# Patient Record
Sex: Female | Born: 1990 | Hispanic: No | Marital: Married | Smoking: Never smoker
Health system: Southern US, Community
[De-identification: ages and names within clinical notes are randomized; demographics above are authoritative.]

---

## 2017-07-28 DIAGNOSIS — R0602 Shortness of breath: Secondary | ICD-10-CM | POA: Diagnosis not present

## 2017-07-28 DIAGNOSIS — Z0000 Encounter for general adult medical examination without abnormal findings: Secondary | ICD-10-CM | POA: Diagnosis not present

## 2017-08-12 DIAGNOSIS — R9431 Abnormal electrocardiogram [ECG] [EKG]: Secondary | ICD-10-CM | POA: Diagnosis not present

## 2017-10-20 DIAGNOSIS — Z23 Encounter for immunization: Secondary | ICD-10-CM | POA: Diagnosis not present

## 2018-03-30 ENCOUNTER — Ambulatory Visit (HOSPITAL_COMMUNITY)
Admission: RE | Admit: 2018-03-30 | Discharge: 2018-03-30 | Disposition: A | Discharge status: Home / Self Care | Payer: Medicaid Other | Source: Ambulatory Visit | Attending: Family Medicine | Admitting: Family Medicine

## 2018-03-30 ENCOUNTER — Encounter: Payer: Self-pay | Admitting: Family Medicine

## 2018-03-30 ENCOUNTER — Ambulatory Visit: Payer: Medicaid Other | Admitting: Family Medicine

## 2018-03-30 VITALS — BP 122/81 | HR 100 | Temp 98.1°F | Ht 60.0 in | Wt 144.0 lb

## 2018-03-30 DIAGNOSIS — R8290 Unspecified abnormal findings in urine: Secondary | ICD-10-CM | POA: Insufficient documentation

## 2018-03-30 DIAGNOSIS — R002 Palpitations: Secondary | ICD-10-CM | POA: Insufficient documentation

## 2018-03-30 DIAGNOSIS — R0602 Shortness of breath: Secondary | ICD-10-CM | POA: Diagnosis present

## 2018-03-30 DIAGNOSIS — N979 Female infertility, unspecified: Secondary | ICD-10-CM | POA: Insufficient documentation

## 2018-03-30 DIAGNOSIS — R1030 Lower abdominal pain, unspecified: Secondary | ICD-10-CM

## 2018-03-30 DIAGNOSIS — R1024 Suprapubic pain: Secondary | ICD-10-CM

## 2018-03-30 DIAGNOSIS — R102 Pelvic and perineal pain: Secondary | ICD-10-CM

## 2018-03-30 DIAGNOSIS — R319 Hematuria, unspecified: Secondary | ICD-10-CM

## 2018-03-30 DIAGNOSIS — R0609 Other forms of dyspnea: Secondary | ICD-10-CM | POA: Diagnosis not present

## 2018-03-30 LAB — POCT URINALYSIS DIP (CLINITEK)
Bilirubin, UA: NEGATIVE
Glucose, UA: NEGATIVE mg/dL
Ketones, POC UA: NEGATIVE mg/dL
Leukocytes, UA: NEGATIVE
Nitrite, UA: NEGATIVE
POC PROTEIN,UA: NEGATIVE
Spec Grav, UA: 1.015
Urobilinogen, UA: 0.2 U/dL
pH, UA: 5.5

## 2018-03-30 LAB — POCT URINE PREGNANCY: Preg Test, Ur: NEGATIVE

## 2018-03-30 NOTE — Progress Notes (Signed)
Subjective:    Patient ID: Melanie Greene, female    DOB: 1990-05-01, 28 y.o.   MRN: [id number]   Due to a language barrier, patient is accompanied by a live interpreter at today's visit.  HPI       28 year old female new to the practice.  Patient states that her main concern at today's visit is that she and her husband have been trying to have another child since the birth of their third child 7 years ago but patient has been unable to become pregnant.  Patient reports that she is having her menses on a regular schedule.  Patient has not been on any kind of contraceptive medication and has not had any contraceptive devices.  Patient states that she had a C-section with her first pregnancy, vaginal birth with a second pregnancy and C-section with her third pregnancy.  Patient did not have any complications during pregnancy or childbirth.  Patient denies any issues with elevated blood pressure/hypertension during pregnancy and no issues with elevated blood sugar during pregnancy.  Patient has not been on any prescription medications and has no significant past medical history.  Patient does not smoke cigarettes or drink alcohol.  Patient is currently employed in a Teacher, adult education.  Family history is negative for fertility issues.  Patient has had no family history of cancer, hypertension, diabetes, heart disease or stroke.       On review of systems, patient denies any headaches or dizziness, no difficulty swallowing, no cough.  Patient does get occasional shortness of breath with exertion.  Patient does not smoke but states that she is exposed to secondhand smoke from her husband.  Patient denies any chest pain but occasionally has a sensation of increased heart rate/pounding heartbeat.  Patient initially denied abdominal pain but then states that sometimes she would have discomfort in her lower abdomen just above her C-section scar.  Discomfort is usually mild and she does not recall any  factors that cause the discomfort to occur.  Patient does have some occasional mild abdominal cramping during her menses.  Patient denies urinary frequency but occasionally feels as if she has some discomfort with urination.  Patient has had no pelvic or vaginal pain.    Review of Systems  Constitutional: Negative for chills, fatigue and fever.  HENT: Negative for congestion, hearing loss, sore throat and trouble swallowing.   Respiratory: Negative for cough and shortness of breath.   Cardiovascular: Positive for palpitations (sensation of fast/pounding heart rate). Negative for chest pain and leg swelling.  Gastrointestinal: Positive for abdominal pain (occasional mid lower abdominal discomfort). Negative for blood in stool, constipation, diarrhea and nausea.  Endocrine: Negative for cold intolerance, heat intolerance, polydipsia, polyphagia and polyuria.  Genitourinary: Positive for dysuria (occasional). Negative for flank pain, frequency, menstrual problem, pelvic pain, vaginal discharge and vaginal pain.  Musculoskeletal: Negative for back pain and gait problem.  Neurological: Negative for dizziness and headaches.  Hematological: Negative for adenopathy. Does not bruise/bleed easily.       Objective:   Physical Exam BP 122/81 (BP Location: Left Arm, Patient Position: Sitting, Cuff Size: Large)   Pulse 100   Temp 98.1 F (36.7 C) (Oral)   Ht 5' (1.524 m)   Wt 144 lb (65.3 kg)   LMP 03/25/2018 (Exact Date)   SpO2 98%   BMI 28.12 kg/m   Nurse's notes and vital signs reviewed  General-well-nourished, well-developed short statured female in no acute distress.  Patient is accompanied by live interpreter  at today's visit ENT- TMs gray, mild edema of the nasal turbinates, normal oropharynx Neck-supple, no lymphadenopathy, no thyromegaly Lungs-clear to auscultation bilaterally Cardiovascular-regular rate and regular rhythm Lungs-clear to auscultation bilaterally, breathing is  nonlabored Abdomen-soft, patient with mild discomfort with palpation over the suprapubic area.  No abnormalities with palpation over the site of prior C-section scar Back-no CVA tenderness Extremities-no edema.      Assessment & Plan:  1. Infertility, female Patient with complaint of inability to become pregnant over the past 7 years.  Patient reports that she is having regular menses.  Patient questions whether or not she may have been given a tubal ligation without her knowledge or consent after the birth of her third child.  Patient will be referred to OB/GYN for further evaluation and treatment of infertility and patient will have urine pregnancy test at today's visit just to rule out current pregnancy. - Ambulatory referral to Obstetrics / Gynecology - POCT urine pregnancy  2. Palpitations Patient reports episodes of feeling as if she has increased heart rate/pounding heart rate.  Patient will have BMP to look for electrolyte abnormality, CBC to look for anemia and TSH to look for thyroid disorder.  If these are normal and patient has continued symptoms, patient may require referral for cardiac monitoring - Basic Metabolic Panel - CBC with Differential - TSH  3. SOB (shortness of breath) on exertion Patient with complaint of shortness of breath with exertion and patient will have CBC to look for possible anemia as a cause of her shortness of breath with exertion and order was placed for patient to have chest x-ray - DG Chest 2 View; Future - CBC with Differential  4. Suprapubic discomfort Patient with suprapubic discomfort on exam and patient will have urinalysis as well as urine hCG and follow-up.  Patient should return to clinic if she has continued symptoms - POCT URINALYSIS DIP (CLIN[facility]K) - POCT urine pregnancy  5. Abnormal urinalysis Patient with abnormal finding of hematuria on urinalysis and urine will be sent for culture.  Patient will be notified if further treatment is  needed based on culture results and will need future repeat UA to see if hematuria is still present.  If patient with continued hematuria on urinalysis then she will need referral to urology - Urine Culture  An After Visit Summary was printed and given to the patient.  Return in about 3 months (around 06/28/2018) for sooner if continued abdominal pain or SOB.

## 2018-03-31 ENCOUNTER — Telehealth: Payer: Self-pay | Admitting: Family Medicine

## 2018-03-31 LAB — CBC WITH DIFFERENTIAL/PLATELET
Basophils Absolute: 0.1 x10E3/uL (ref 0.0–0.2)
Basos: 1 %
EOS (ABSOLUTE): 0.4 x10E3/uL (ref 0.0–0.4)
Eos: 6 %
Hematocrit: 41 % (ref 34.0–46.6)
Hemoglobin: 14.1 g/dL (ref 11.1–15.9)
Immature Grans (Abs): 0 x10E3/uL (ref 0.0–0.1)
Immature Granulocytes: 0 %
Lymphocytes Absolute: 1.8 x10E3/uL (ref 0.7–3.1)
Lymphs: 27 %
MCH: 30.7 pg (ref 26.6–33.0)
MCHC: 34.4 g/dL (ref 31.5–35.7)
MCV: 89 fL (ref 79–97)
Monocytes Absolute: 0.5 x10E3/uL (ref 0.1–0.9)
Monocytes: 8 %
Neutrophils Absolute: 3.8 x10E3/uL (ref 1.4–7.0)
Neutrophils: 58 %
Platelets: 217 x10E3/uL (ref 150–450)
RBC: 4.6 x10E6/uL (ref 3.77–5.28)
RDW: 12.2 % (ref 11.7–15.4)
WBC: 6.6 x10E3/uL (ref 3.4–10.8)

## 2018-03-31 LAB — BASIC METABOLIC PANEL WITH GFR
BUN/Creatinine Ratio: 19 (ref 9–23)
BUN: 10 mg/dL (ref 6–20)
CO2: 24 mmol/L (ref 20–29)
Calcium: 9 mg/dL (ref 8.7–10.2)
Chloride: 103 mmol/L (ref 96–106)
Creatinine, Ser: 0.52 mg/dL — ABNORMAL LOW (ref 0.57–1.00)
GFR calc Af Amer: 149 mL/min/1.73
GFR calc non Af Amer: 130 mL/min/1.73
Glucose: 79 mg/dL (ref 65–99)
Potassium: 3.4 mmol/L — ABNORMAL LOW (ref 3.5–5.2)
Sodium: 141 mmol/L (ref 134–144)

## 2018-03-31 LAB — TSH: TSH: 2.89 u[IU]/mL (ref 0.450–4.500)

## 2018-03-31 NOTE — Telephone Encounter (Signed)
I left a voice mail to patient to let her know that Medicaid don't pay for infertility Patient can contact her Social Worker or   if she decide to pay out of her pocket  [facility]  Dr. Lyndal Rainbow office at [facility] ([address] [place]  [address]   Ph# [phone]

## 2018-04-01 LAB — URINE CULTURE

## 2018-04-14 ENCOUNTER — Telehealth: Payer: Self-pay | Admitting: *Deleted

## 2018-04-14 NOTE — Telephone Encounter (Signed)
Medical Assistant used [employer] to contact patient.  Interpreter Name: Maylon Cos #: [id number] Patient was not available, [employer] left patient a voicemail. Medical Assistant left message on patient's home and cell voicemail. Voicemail states to give a call back to [place] with [facility] at [phone].

## 2018-04-14 NOTE — Telephone Encounter (Signed)
-----   Message from Cain Saupe, MD sent at 04/01/2018 12:51 AM EST ----- Notify patient of normal CBC and TSH. On BMP, potassium is just below normal at 3.4 (normal 3.5-5.2). Increase potassium rich foods such as bananas or orange juice to raise potassium level

## 2018-04-20 NOTE — Telephone Encounter (Signed)
Medical Assistant used [employer] to contact patient.  Interpreter Name: Marisue Humble  Interpreter #: [phone] Medical Assistant left message on patient's home and cell voicemail. Voicemail states to give a call back to [place] with [facility] at [phone]. Communication letter has been mailed out to patient.

## 2018-04-20 NOTE — Progress Notes (Signed)
Melanie Greene, I will call.

## 2018-06-30 ENCOUNTER — Ambulatory Visit: Payer: Medicaid Other | Admitting: Family Medicine

## 2018-12-29 DIAGNOSIS — Z23 Encounter for immunization: Secondary | ICD-10-CM | POA: Diagnosis not present

## 2019-01-19 DIAGNOSIS — Z23 Encounter for immunization: Secondary | ICD-10-CM | POA: Diagnosis not present

## 2020-03-22 IMAGING — DX DG CHEST 2V
2 series · 2 of 2 positions shown · non-contrast
Comparison: None.

CLINICAL DATA: Sob on exertion

EXAM:
CHEST - 2 VIEW

[chest pa]
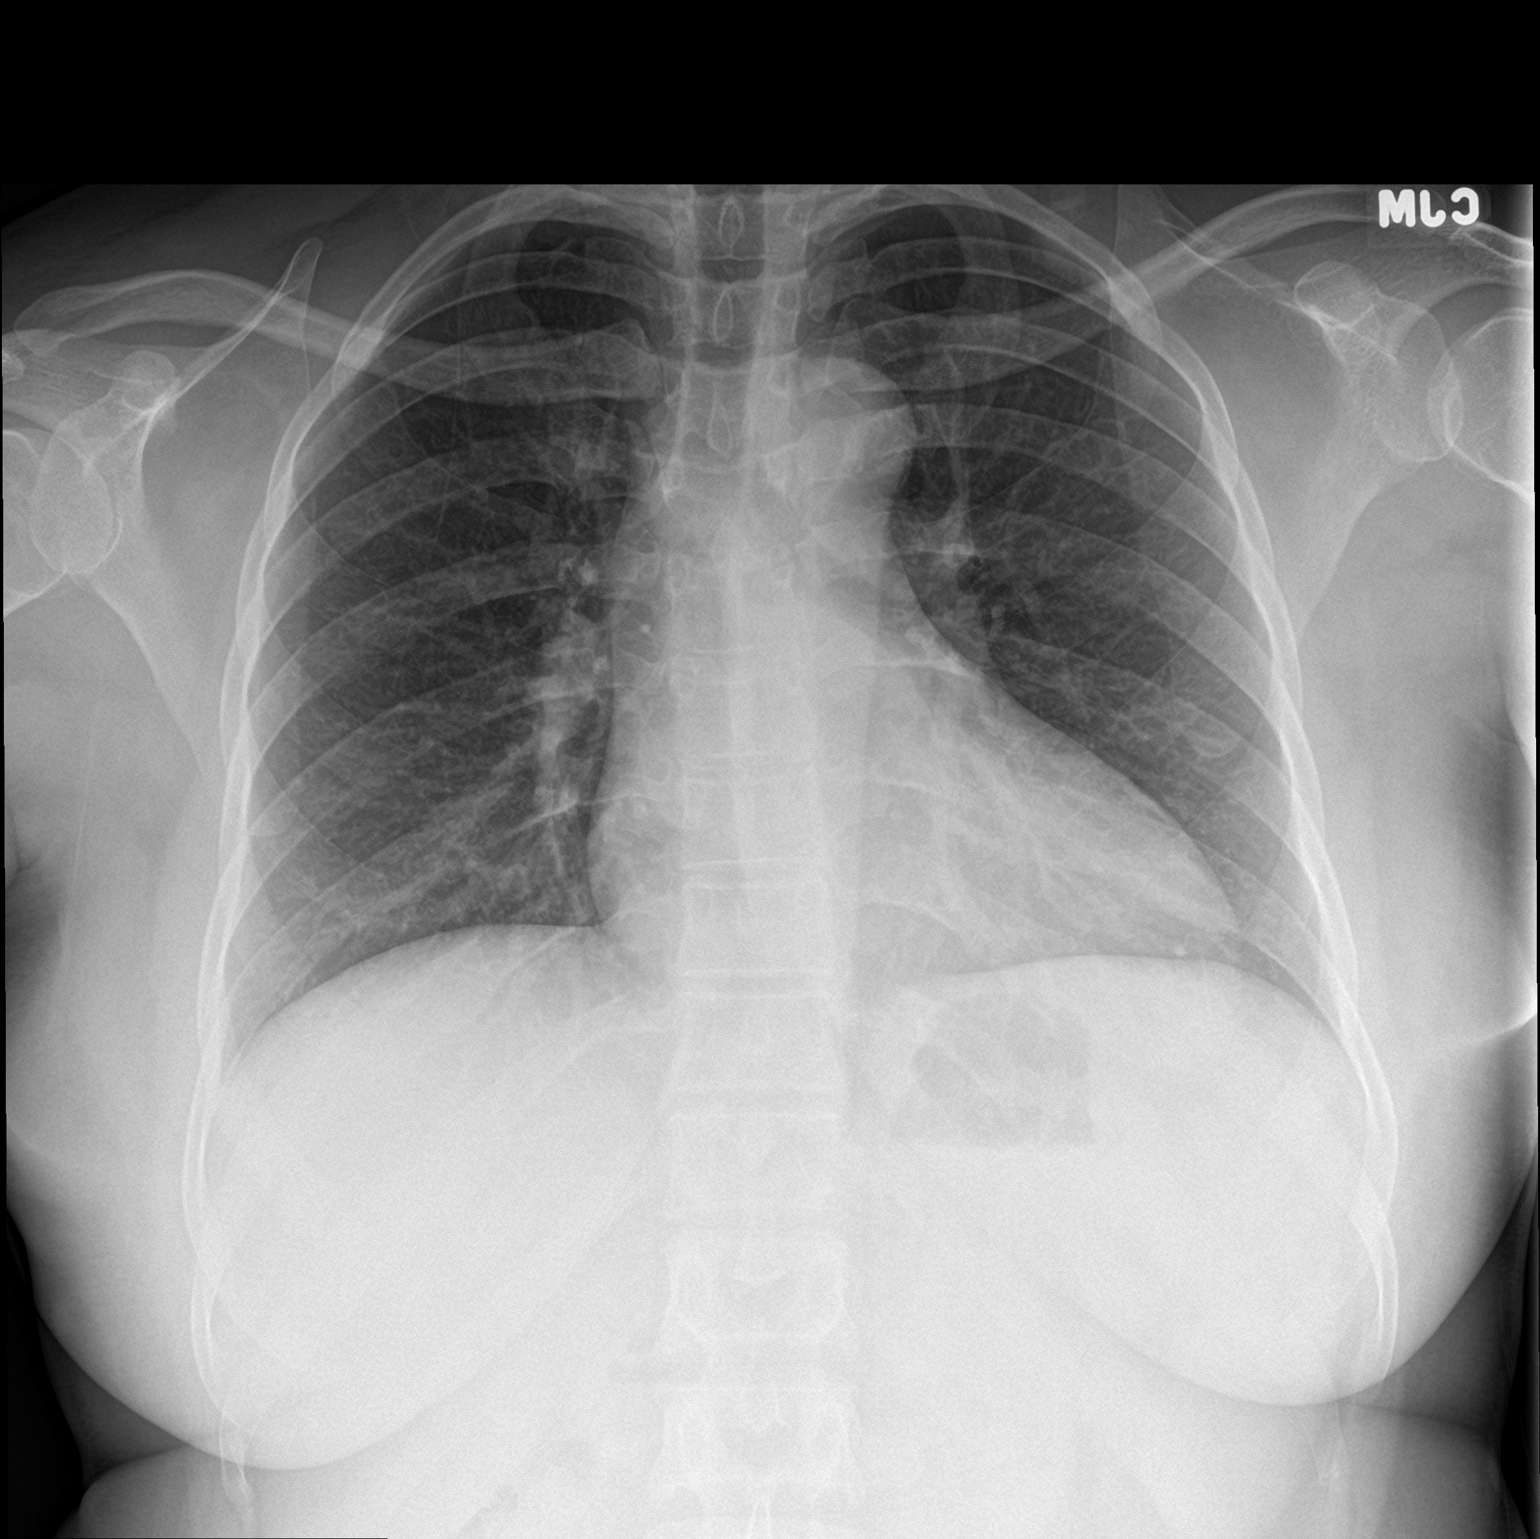

[chest lat]
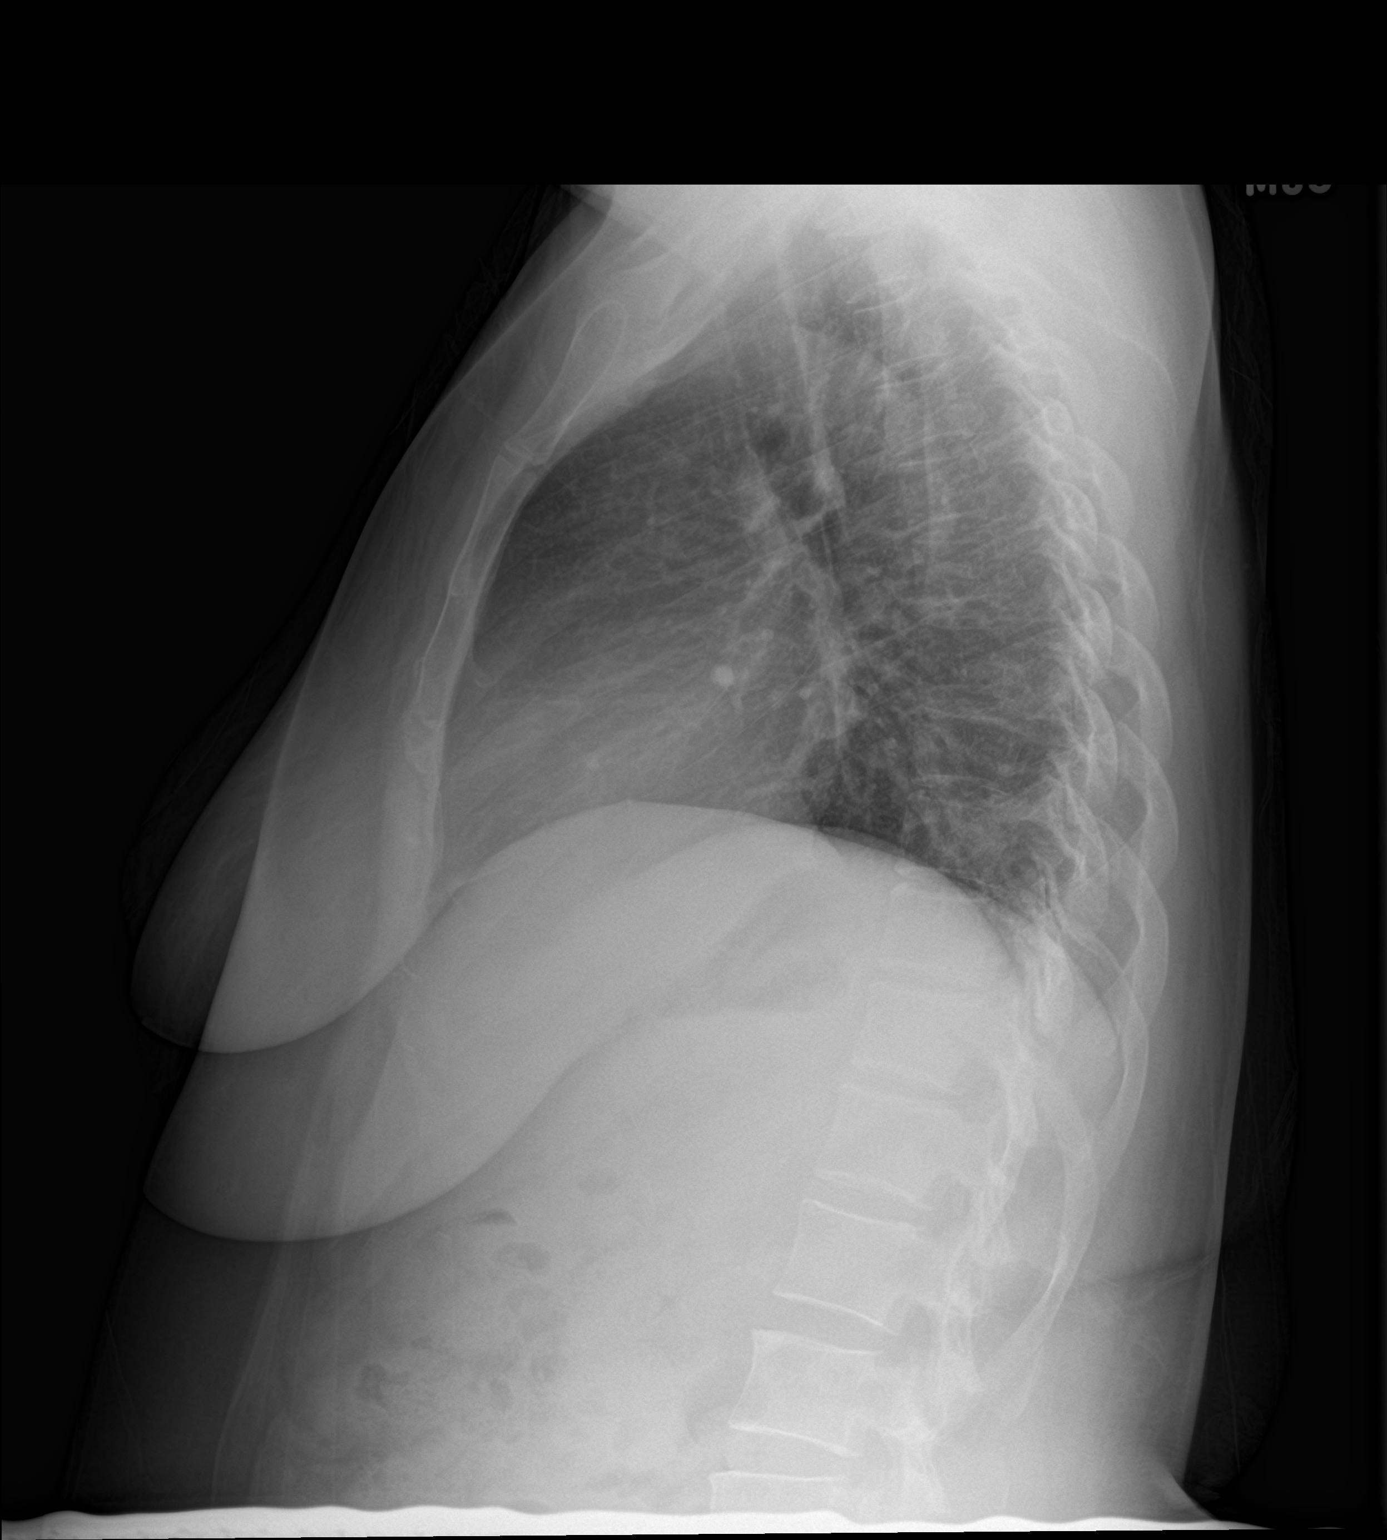

[2 of 2 positions shown; findings below may reference images not displayed]

FINDINGS: The heart size and mediastinal contours are within normal limits.
Both lungs are clear. The visualized skeletal structures are
unremarkable.
IMPRESSION: No active cardiopulmonary disease.

## 2020-05-01 ENCOUNTER — Other Ambulatory Visit: Payer: Self-pay

## 2020-05-01 ENCOUNTER — Encounter: Payer: Self-pay | Admitting: Family Medicine

## 2020-05-01 ENCOUNTER — Ambulatory Visit: Payer: Medicaid Other | Attending: Family Medicine | Admitting: Family Medicine

## 2020-05-01 VITALS — BP 138/84 | HR 60 | Ht 59.0 in | Wt 132.4 lb

## 2020-05-01 DIAGNOSIS — N979 Female infertility, unspecified: Secondary | ICD-10-CM

## 2020-05-01 NOTE — Patient Instructions (Signed)
Female Infertility  Female infertility refers to a woman's inability to get pregnant (conceive) after a year of having sex regularly (or after 6 months in women over age 31) without using birth control. Infertility can also mean that a woman is not able to carry a pregnancy to full term. Both women and men can have fertility problems. What are the causes? This condition may be caused by:  Problems with reproductive organs. Infertility can result if a woman: ? Has an abnormally short cervix or a cervix that does not remain closed during a pregnancy. ? Has a blockage or scarring in the fallopian tubes. ? Has an abnormally shaped uterus. ? Has uterine fibroids. This is a benign mass of tissue or muscle (tumor) that can develop in the uterus. ? Is not ovulating in a regular way.  Certain medical conditions. These may include: ? Polycystic ovary syndrome (PCOS). This is a hormonal disorder that can cause small cysts to grow on the ovaries. This is the most common cause of infertility in women. ? Endometriosis. This is a condition in which the tissue that lines the uterus (endometrium) grows outside of its normal location. ? Cancer and cancer treatments, such as chemotherapy or radiation. ? Premature ovarian failure. This is when ovaries stop producing eggs and hormones before age 40. ? Sexually transmitted diseases, such as chlamydia or gonorrhea. ? Autoimmune disorders. These are disorders in which the body's defense system (immune system) attacks normal, healthy cells. Infertility can be linked to more than one cause. For some women, the cause of infertility is not known (unexplained infertility). What increases the risk?  Age. A woman's fertility declines with age, especially after her mid-30s.  Being underweight or overweight.  Drinking too much alcohol.  Using drugs such as anabolic steroids, cocaine, and marijuana.  Exercising excessively.  Being exposed to environmental toxins,  such as radiation, pesticides, and certain chemicals. What are the signs or symptoms? The main sign of infertility in women is the inability to get pregnant or carry a pregnancy to full term. How is this diagnosed? This condition may be diagnosed by:  Checking whether you are ovulating each month. The tests may include: ? Blood tests to check hormone levels. ? An ultrasound of the ovaries. ? Taking a small tissue that lines the uterus and checking it under a microscope (endometrial biopsy).  Doing additional tests. This is done if ovulation is normal. Tests may include: ? Hysterosalpingography. This X-ray test can show the shape of the uterus and whether the fallopian tubes are open. ? Laparoscopy. This test uses a lighted tube (laparoscope) to look for problems in the fallopian tubes and other organs. ? Transvaginal ultrasound. This imaging test is used to check for abnormalities in the uterus and ovaries. ? Hysteroscopy. This test uses a lighted tube to check for problems in the cervix and the uterus. To be diagnosed with infertility, both partners will have a physical exam. Both partners will also have an extensive medical and sexual history taken. Additional tests may be done. How is this treated? Treatment depends on the cause of infertility. Most cases of infertility in women are treated with medicine or surgery.  Women may take medicine to: ? Correct ovulation problems. ? Treat other health conditions.  Surgery may be done to: ? Repair damage to the ovaries, fallopian tubes, cervix, or uterus. ? Remove growths from the uterus. ? Remove scar tissue from the uterus, pelvis, or other organs. Assisted reproductive technology (ART) Assisted reproductive technology (  ART) refers to all treatments and procedures that combine eggs and sperm outside the body to try to help a couple conceive. ART is often combined with fertility drugs to stimulate ovulation. Sometimes ART is done using eggs  retrieved from another woman's body (donor eggs) or from previously frozen fertilized eggs (embryos). There are different types of ART. These include:  Intrauterine insemination (IUI). A long, thin tube is used to place sperm directly into a woman's uterus. This procedure: ? Is effective for infertility caused by sperm problems, including low sperm count and low motility. ? Can be used in combination with fertility drugs.  In vitro fertilization (IVF). This is done when a woman's fallopian tubes are blocked or when a man has low sperm count. In this procedure: ? Fertility drugs are used to stimulate the ovaries to produce multiple eggs. ? Once mature, these eggs are removed from the body and combined with the sperm to be fertilized. ? The fertilized eggs are then placed into the woman's uterus. Follow these instructions at home:  Take over-the-counter and prescription medicines only as told by your health care provider.  Do not use any products that contain nicotine or tobacco, such as cigarettes and e-cigarettes. If you need help quitting, ask your health care provider.  If you drink alcohol, limit how much you have to 1 drink a day.  Make dietary changes to lose weight or maintain a healthy weight. Work with your health care provider and a dietitian to set a weight-loss goal that is healthy and reasonable for you.  Seek support from a counselor or support group to talk about your concerns related to infertility. Couples counseling may be helpful for you and your partner.  Practice stress reduction techniques that work well for you, such as regular physical activity, meditation, or deep breathing.  Keep all follow-up visits as told by your health care provider. This is important. Contact a health care provider if you:  Feel that stress is interfering with your life and relationships.  Have side effects from treatments for infertility. Summary  Female infertility refers to a woman's  inability to get pregnant (conceive) after a year of having sex regularly (or after 6 months in women over age 31) without using birth control.  To be diagnosed with infertility, both partners will have a physical exam. Both partners will also have an extensive medical and sexual history taken.  Seek support from a counselor or support group to talk about your concerns related to infertility. Couples counseling may be helpful for you and your partner. This information is not intended to replace advice given to you by your health care provider. Make sure you discuss any questions you have with your health care provider. Document Revised: 11/03/2019 Document Reviewed: 09/23/2019 Elsevier Patient Education  2021 Elsevier Inc.  

## 2020-05-01 NOTE — Progress Notes (Signed)
Pt is wanting to have a baby.

## 2020-05-01 NOTE — Progress Notes (Signed)
Subjective:  Patient ID: Melanie Greene, female    DOB: 21-Jan-1991  Age: 30 y.o. MRN: [id number]  CC: Establish Care   HPI Melanie Greene presents to establish care. She complains of inability to conceive for the last 9 years. She has three other children from the same marriage who are 66, 1 and 31 years of age and they were conceived naturally.The third child was born through cesarean section. She is unsure if she has undergone any form of tubal ligation.  She is sexually active about 3 times a week I also saw her husband today for infertility as well. Her periods have been normal and occur monthly; LMP was 04/27/20 and it lasts for 3 days.  No past medical history on file.  Past Surgical History:  Procedure Laterality Date  . CESAREAN SECTION      Family History  Problem Relation Age of Onset  . Diabetes Neg Hx   . Hypertension Neg Hx   . Cancer Neg Hx     No Known Allergies  No outpatient medications prior to visit.   No facility-administered medications prior to visit.     ROS Review of Systems  Constitutional: Negative for activity change, appetite change and fatigue.  HENT: Negative for congestion, sinus pressure and sore throat.   Eyes: Negative for visual disturbance.  Respiratory: Negative for cough, chest tightness, shortness of breath and wheezing.   Cardiovascular: Negative for chest pain and palpitations.  Gastrointestinal: Negative for abdominal distention, abdominal pain and constipation.  Endocrine: Negative for polydipsia.  Genitourinary: Negative for dysuria and frequency.  Musculoskeletal: Negative for arthralgias and back pain.  Skin: Negative for rash.  Neurological: Negative for tremors, light-headedness and numbness.  Hematological: Does not bruise/bleed easily.  Psychiatric/Behavioral: Negative for agitation and behavioral problems.    Objective:  BP 138/84   Pulse 60   Ht 4\' 11"  (1.499 m)   Wt 132 lb 6.4 oz (60.1 kg)   SpO2 100%   BMI  26.74 kg/m   BP/Weight 05/01/2020 03/30/2018  Systolic BP 138 122  Diastolic BP 84 81  Wt. (Lbs) 132.4 144  BMI 26.74 28.12      Physical Exam Constitutional:      Appearance: She is well-developed.  Neck:     Vascular: No JVD.  Cardiovascular:     Rate and Rhythm: Normal rate.     Heart sounds: Normal heart sounds. No murmur heard.   Pulmonary:     Effort: Pulmonary effort is normal.     Breath sounds: Normal breath sounds. No wheezing or rales.  Chest:     Chest wall: No tenderness.  Abdominal:     General: Bowel sounds are normal. There is no distension.     Palpations: Abdomen is soft. There is no mass.     Tenderness: There is no abdominal tenderness.  Musculoskeletal:        General: Normal range of motion.     Right lower leg: No edema.     Left lower leg: No edema.  Neurological:     Mental Status: She is alert and oriented to person, place, and time.  Psychiatric:        Mood and Affect: Mood normal.     CMP Latest Ref Rng & Units 03/30/2018  Glucose 65 - 99 mg/dL 79  BUN 6 - 20 mg/dL 10  Creatinine 04/01/2018 - 1.76 mg/dL 1.60)  Sodium 7.37(T - 062 mmol/L 141  Potassium 3.5 - 5.2 mmol/L 3.4(L)  Chloride  96 - 106 mmol/L 103  CO2 20 - 29 mmol/L 24  Calcium 8.7 - 10.2 mg/dL 9.0    Lipid Panel  No results found for: CHOL, TRIG, HDL, CHOLHDL, VLDL, LDLCALC, LDLDIRECT  CBC    Component Value Date/Time   WBC 6.6 03/30/2018 1127   RBC 4.60 03/30/2018 1127   HGB 14.1 03/30/2018 1127   HCT 41.0 03/30/2018 1127   PLT 217 03/30/2018 1127   MCV 89 03/30/2018 1127   MCH 30.7 03/30/2018 1127   MCHC 34.4 03/30/2018 1127   RDW 12.2 03/30/2018 1127   LYMPHSABS 1.8 03/30/2018 1127   EOSABS 0.4 03/30/2018 1127   BASOSABS 0.1 03/30/2018 1127    No results found for: HGBA1C  Assessment & Plan:  1. 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   Return in about 6 months (around 11/01/2020) for infertility.       Hoy Register, MD, FAAFP. [facility] [place], [place] [phone]   05/01/2020, 4:04 PM

## 2020-05-02 LAB — CBC WITH DIFFERENTIAL/PLATELET
Basophils Absolute: 0 10*3/uL (ref 0.0–0.2)
Basos: 1 %
EOS (ABSOLUTE): 0.4 10*3/uL (ref 0.0–0.4)
Eos: 6 %
Hematocrit: 41.2 % (ref 34.0–46.6)
Hemoglobin: 14.2 g/dL (ref 11.1–15.9)
Immature Grans (Abs): 0 10*3/uL (ref 0.0–0.1)
Immature Granulocytes: 0 %
Lymphocytes Absolute: 1.8 10*3/uL (ref 0.7–3.1)
Lymphs: 29 %
MCH: 31.3 pg (ref 26.6–33.0)
MCHC: 34.5 g/dL (ref 31.5–35.7)
MCV: 91 fL (ref 79–97)
Monocytes Absolute: 0.6 10*3/uL (ref 0.1–0.9)
Monocytes: 9 %
Neutrophils Absolute: 3.4 10*3/uL (ref 1.4–7.0)
Neutrophils: 55 %
Platelets: 207 10*3/uL (ref 150–450)
RBC: 4.53 x10E6/uL (ref 3.77–5.28)
RDW: 12.4 % (ref 11.7–15.4)
WBC: 6.3 10*3/uL (ref 3.4–10.8)

## 2020-05-02 LAB — HEMOGLOBIN A1C
Est. average glucose Bld gHb Est-mCnc: 91 mg/dL
Hgb A1c MFr Bld: 4.8 % (ref 4.8–5.6)

## 2020-05-02 LAB — FSH/LH
FSH: 5.4 m[IU]/mL
LH: 15.2 m[IU]/mL

## 2020-05-02 LAB — TSH: TSH: 4.82 u[IU]/mL — ABNORMAL HIGH (ref 0.450–4.500)

## 2020-05-02 LAB — T4, FREE: Free T4: 1.25 ng/dL (ref 0.82–1.77)

## 2020-05-04 ENCOUNTER — Telehealth: Payer: Self-pay

## 2020-05-04 NOTE — Telephone Encounter (Signed)
Patient was called and a voicemail was left informing patient to return phone call for lab results. 

## 2020-05-04 NOTE — Telephone Encounter (Signed)
Pt was informed of lab results and given a copy of results.

## 2020-05-04 NOTE — Telephone Encounter (Signed)
-----   Message from Hoy Register, MD sent at 05/03/2020  1:01 PM EDT ----- Please inform her that labs are normal except for thyroid which is slightly abnormal.  We will need to repeat this at her next visit.

## 2020-05-10 ENCOUNTER — Other Ambulatory Visit: Payer: Self-pay | Admitting: Family Medicine

## 2020-05-10 ENCOUNTER — Ambulatory Visit (HOSPITAL_COMMUNITY): Payer: Medicaid Other

## 2020-05-10 ENCOUNTER — Telehealth: Payer: Self-pay | Admitting: Family Medicine

## 2020-05-10 NOTE — Telephone Encounter (Signed)
Dr Hyacinth Meeker office want you to know they have tried and tried to reach the pt and it goes straight to VM.  So they have NOT ben able to see pt . Referral will be closed.

## 2020-05-28 ENCOUNTER — Inpatient Hospital Stay: Admission: RE | Admit: 2020-05-28 | Payer: Medicaid Other | Source: Ambulatory Visit

## 2020-07-04 ENCOUNTER — Ambulatory Visit (HOSPITAL_BASED_OUTPATIENT_CLINIC_OR_DEPARTMENT_OTHER): Payer: Medicaid Other | Admitting: Obstetrics & Gynecology

## 2020-07-16 ENCOUNTER — Ambulatory Visit: Payer: Medicaid Other | Admitting: Family Medicine

## 2020-07-17 ENCOUNTER — Encounter: Payer: Self-pay | Admitting: Family Medicine

## 2020-07-17 ENCOUNTER — Other Ambulatory Visit: Payer: Self-pay

## 2020-07-17 ENCOUNTER — Ambulatory Visit: Payer: Medicaid Other | Attending: Family Medicine | Admitting: Family Medicine

## 2020-07-17 DIAGNOSIS — N979 Female infertility, unspecified: Secondary | ICD-10-CM

## 2020-07-17 DIAGNOSIS — R7989 Other specified abnormal findings of blood chemistry: Secondary | ICD-10-CM

## 2020-07-17 NOTE — Progress Notes (Addendum)
   Virtual Visit via Telephone Note  I connected with Melanie Greene, on 07/17/2020 at 3:27 PM by telephone due to the COVID-19 pandemic and verified that I am speaking with the correct person using two identifiers.   Consent: I discussed the limitations, risks, security and privacy concerns of performing an evaluation and management service by telephone and the availability of in person appointments. I also discussed with the patient that there may be a patient responsible charge related to this service. The patient expressed understanding and agreed to proceed.   Location of Patient: Home  Location of Provider: Clinic   Persons participating in Telemedicine visit: Paizlie Novakowski Interpreter Dr. Alvis Lemmings     History of Present Illness: 30 year old female with secondary infertility here for a follow up visit.  She has 3 other Children with her current spouse but has Been unable to conceive for the last 9 years.  I had referred her for Pelvic Ultrasound in 05/2020 which she no showed. I also referred her to the Fertility specialist and she no showed as well. Husband informs me he went to '[facility] and they did dot find his name in the system'. It appears the husband provided his name rather than his wife's name at check in. He tells me he thought the appointment was for both of them.  Lab result from last visit revealed slightly elevated TSH, other labs were unremarkable. Patient did not receive this message as her voice mail was not set up.   No past medical history on file. No Known Allergies  No current outpatient medications on file prior to visit.   No current facility-administered medications on file prior to visit.    ROS: See HPI  Observations/Objective: Awake, alert, oriented x3 Not in acute distress Normal mood  CMP Latest Ref Rng & Units 03/30/2018  Glucose 65 - 99 mg/dL 79  BUN 6 - 20 mg/dL 10  Creatinine 1.60 - 7.37 mg/dL 1.06(Y)  Sodium 694 - 854 mmol/L  141  Potassium 3.5 - 5.2 mmol/L 3.4(L)  Chloride 96 - 106 mmol/L 103  CO2 20 - 29 mmol/L 24  Calcium 8.7 - 10.2 mg/dL 9.0    Lab Results  Component Value Date   TSH 4.820 (H) 05/01/2020    Assessment and Plan: 1. 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  Addressed to OB/GYN on [facility] Freada Bergeron has been provided and the phone number to the clinic.  2. Abnormal thyroid blood test Schedule for repeat thyroid panel - TSH; Future - T4, free; Future   Follow Up Instructions: Advised to keep appointment with GYN and we will follow-up with her thyroid labs   I discussed the assessment and treatment plan with the patient. The patient was provided an opportunity to ask questions and all were answered. The patient agreed with the plan and demonstrated an understanding of the instructions.   The patient was advised to call back or seek an in-person evaluation if the symptoms worsen or if the condition fails to improve as anticipated.     I provided 12 minutes total of non-face-to-face time during this encounter.   Hoy Register, MD, FAAFP. [facility] [place], [place] [phone]   07/17/2020, 3:27 PM

## 2020-07-18 ENCOUNTER — Other Ambulatory Visit: Payer: Self-pay

## 2020-07-18 ENCOUNTER — Ambulatory Visit: Payer: Medicaid Other | Attending: Family Medicine

## 2020-07-18 DIAGNOSIS — R7989 Other specified abnormal findings of blood chemistry: Secondary | ICD-10-CM

## 2020-07-19 LAB — TSH: TSH: 3.36 u[IU]/mL (ref 0.450–4.500)

## 2020-07-19 LAB — T4, FREE: Free T4: 1.34 ng/dL (ref 0.82–1.77)

## 2020-10-01 ENCOUNTER — Ambulatory Visit (HOSPITAL_BASED_OUTPATIENT_CLINIC_OR_DEPARTMENT_OTHER): Payer: Medicaid Other | Admitting: Obstetrics & Gynecology

## 2021-08-28 ENCOUNTER — Ambulatory Visit: Payer: Medicaid Other | Admitting: Family Medicine

## 2022-11-04 DIAGNOSIS — Z419 Encounter for procedure for purposes other than remedying health state, unspecified: Secondary | ICD-10-CM | POA: Diagnosis not present

## 2022-12-05 DIAGNOSIS — Z419 Encounter for procedure for purposes other than remedying health state, unspecified: Secondary | ICD-10-CM | POA: Diagnosis not present

## 2023-01-04 DIAGNOSIS — Z419 Encounter for procedure for purposes other than remedying health state, unspecified: Secondary | ICD-10-CM | POA: Diagnosis not present

## 2023-02-04 DIAGNOSIS — Z419 Encounter for procedure for purposes other than remedying health state, unspecified: Secondary | ICD-10-CM | POA: Diagnosis not present

## 2023-03-07 DIAGNOSIS — Z419 Encounter for procedure for purposes other than remedying health state, unspecified: Secondary | ICD-10-CM | POA: Diagnosis not present

## 2023-04-04 DIAGNOSIS — Z419 Encounter for procedure for purposes other than remedying health state, unspecified: Secondary | ICD-10-CM | POA: Diagnosis not present

## 2023-05-16 DIAGNOSIS — Z419 Encounter for procedure for purposes other than remedying health state, unspecified: Secondary | ICD-10-CM | POA: Diagnosis not present

## 2023-06-15 DIAGNOSIS — Z419 Encounter for procedure for purposes other than remedying health state, unspecified: Secondary | ICD-10-CM | POA: Diagnosis not present

## 2023-07-16 DIAGNOSIS — Z419 Encounter for procedure for purposes other than remedying health state, unspecified: Secondary | ICD-10-CM | POA: Diagnosis not present

## 2023-08-15 DIAGNOSIS — Z419 Encounter for procedure for purposes other than remedying health state, unspecified: Secondary | ICD-10-CM | POA: Diagnosis not present

## 2023-09-15 DIAGNOSIS — Z419 Encounter for procedure for purposes other than remedying health state, unspecified: Secondary | ICD-10-CM | POA: Diagnosis not present

## 2023-10-15 ENCOUNTER — Encounter (HOSPITAL_COMMUNITY): Payer: Self-pay | Admitting: *Deleted

## 2023-10-15 ENCOUNTER — Emergency Department (HOSPITAL_COMMUNITY)
Admission: EM | Admit: 2023-10-15 | Discharge: 2023-10-15 | Disposition: A | Discharge status: Home / Self Care | Attending: Emergency Medicine | Admitting: Emergency Medicine

## 2023-10-15 ENCOUNTER — Emergency Department (HOSPITAL_COMMUNITY)

## 2023-10-15 ENCOUNTER — Other Ambulatory Visit: Payer: Self-pay

## 2023-10-15 DIAGNOSIS — D271 Benign neoplasm of left ovary: Secondary | ICD-10-CM | POA: Diagnosis not present

## 2023-10-15 DIAGNOSIS — R1084 Generalized abdominal pain: Secondary | ICD-10-CM | POA: Diagnosis not present

## 2023-10-15 DIAGNOSIS — R188 Other ascites: Secondary | ICD-10-CM | POA: Diagnosis not present

## 2023-10-15 LAB — I-STAT CHEM 8, ED
BUN: 8 mg/dL (ref 6–20)
Calcium, Ion: 1.11 mmol/L — ABNORMAL LOW (ref 1.15–1.40)
Chloride: 104 mmol/L (ref 98–111)
Creatinine, Ser: 0.6 mg/dL (ref 0.44–1.00)
Glucose, Bld: 113 mg/dL — ABNORMAL HIGH (ref 70–99)
HCT: 41 % (ref 36.0–46.0)
Hemoglobin: 13.9 g/dL (ref 12.0–15.0)
Potassium: 3.7 mmol/L (ref 3.5–5.1)
Sodium: 139 mmol/L (ref 135–145)
TCO2: 21 mmol/L — ABNORMAL LOW (ref 22–32)

## 2023-10-15 LAB — COMPREHENSIVE METABOLIC PANEL WITH GFR
ALT: 10 U/L (ref 0–44)
AST: 19 U/L (ref 15–41)
Albumin: 4 g/dL (ref 3.5–5.0)
Alkaline Phosphatase: 65 U/L (ref 38–126)
Anion gap: 13 (ref 5–15)
BUN: 6 mg/dL (ref 6–20)
CO2: 21 mmol/L — ABNORMAL LOW (ref 22–32)
Calcium: 9.1 mg/dL (ref 8.9–10.3)
Chloride: 104 mmol/L (ref 98–111)
Creatinine, Ser: 0.57 mg/dL (ref 0.44–1.00)
GFR, Estimated: >60 mL/min (ref >60)
Glucose, Bld: 112 mg/dL — ABNORMAL HIGH (ref 70–99)
Potassium: 3.7 mmol/L (ref 3.5–5.1)
Sodium: 138 mmol/L (ref 135–145)
Total Bilirubin: 1.5 mg/dL — ABNORMAL HIGH (ref 0.0–1.2)
Total Protein: 7.5 g/dL (ref 6.5–8.1)

## 2023-10-15 LAB — URINALYSIS, ROUTINE W REFLEX MICROSCOPIC
Bilirubin Urine: NEGATIVE
Glucose, UA: NEGATIVE mg/dL
Ketones, ur: 5 mg/dL — AB
Leukocytes,Ua: NEGATIVE
Nitrite: NEGATIVE
Protein, ur: 30 mg/dL — AB
RBC / HPF: >50 RBC/hpf (ref 0–5)
Specific Gravity, Urine: 1.012 (ref 1.005–1.030)
pH: 7 (ref 5.0–8.0)

## 2023-10-15 LAB — CBC
HCT: 40.5 % (ref 36.0–46.0)
Hemoglobin: 14.1 g/dL (ref 12.0–15.0)
MCH: 30 pg (ref 26.0–34.0)
MCHC: 34.8 g/dL (ref 30.0–36.0)
MCV: 86.2 fL (ref 80.0–100.0)
Platelets: 252 K/uL (ref 150–400)
RBC: 4.7 MIL/uL (ref 3.87–5.11)
RDW: 11.8 % (ref 11.5–15.5)
WBC: 8.9 K/uL (ref 4.0–10.5)
nRBC: 0 % (ref 0.0–0.2)

## 2023-10-15 LAB — HCG, SERUM, QUALITATIVE: Preg, Serum: NEGATIVE

## 2023-10-15 LAB — LIPASE, BLOOD: Lipase: 33 U/L (ref 11–51)

## 2023-10-15 MED ORDER — IOHEXOL 350 MG/ML SOLN
75.0000 mL | Freq: Once | INTRAVENOUS | Status: AC | PRN
  Administered 2023-10-15: 75 mL via INTRAVENOUS

## 2023-10-15 MED ORDER — IBUPROFEN 600 MG PO TABS: 600.0000 mg | ORAL_TABLET | Freq: Four times a day (QID) | ORAL | 0 refills | Status: AC | PRN

## 2023-10-15 MED ORDER — KETOROLAC TROMETHAMINE 30 MG/ML IJ SOLN
30.0000 mg | Freq: Once | INTRAMUSCULAR | Status: AC
  Administered 2023-10-15: 30 mg via INTRAVENOUS
  Filled 2023-10-15: qty 1

## 2023-10-15 MED ORDER — ONDANSETRON HCL 4 MG/2ML IJ SOLN
4.0000 mg | Freq: Once | INTRAMUSCULAR | Status: AC
  Administered 2023-10-15: 4 mg via INTRAVENOUS
  Filled 2023-10-15: qty 2

## 2023-10-15 MED ORDER — ONDANSETRON 4 MG PO TBDP: 4.0000 mg | ORAL_TABLET | Freq: Three times a day (TID) | ORAL | 0 refills | Status: AC | PRN

## 2023-10-15 NOTE — ED Provider Notes (Signed)
 [place] [facility] DEPARTMENT AT [facility] Provider Note   CSN: [id number] Arrival date & time: 10/15/23  0200     Patient presents with: Abdominal Pain and Emesis   Swahili translator used  Melanie Greene is a 33 y.o. female who reports no acute medical history, does report a history of C-section, presented to the ED with abdominal pain and nausea.  Reports symptoms ongoing for about 3 days.  She has she felt nauseated and vomited this morning.  She reports in regular bowel movement earlier today.  She denies dysuria.  She says she is on her menstrual cycle but denies that this pain is the same as her cramping pain.  She is here with her husband present at bedside.  The patient denies any vaginal discharge or concern for pelvic infection   HPI     Prior to Admission medications   Medication Sig Start Date End Date Taking? Authorizing Provider  ibuprofen  (ADVIL ) 600 MG tablet Take 1 tablet (600 mg total) by mouth every 6 (six) hours as needed for up to 30 doses for mild pain (pain score 1-3) or moderate pain (pain score 4-6). 10/15/23  Yes Cottie Donnice PARAS, MD  ondansetron  (ZOFRAN -ODT) 4 MG disintegrating tablet Take 1 tablet (4 mg total) by mouth every 8 (eight) hours as needed for up to 12 doses for nausea or vomiting. 10/15/23  Yes Harlo Jaso, Donnice PARAS, MD    Allergies: Patient has no known allergies.    Review of Systems  Updated Vital Signs BP (!) 154/99   Pulse (!) 53   Temp 98.7 F (37.1 C)   Resp 14   Ht 4' 11 (1.499 m)   Wt 60.1 kg   LMP 09/25/2023   SpO2 100%   BMI 26.76 kg/m   Physical Exam Constitutional:      General: She is not in acute distress. HENT:     Head: Normocephalic and atraumatic.  Eyes:     Conjunctiva/sclera: Conjunctivae normal.     Pupils: Pupils are equal, round, and reactive to light.  Cardiovascular:     Rate and Rhythm: Normal rate and regular rhythm.  Pulmonary:     Effort: Pulmonary effort is normal. No respiratory  distress.  Abdominal:     General: There is no distension.     Tenderness: There is abdominal tenderness in the right lower quadrant, suprapubic area and left lower quadrant.  Skin:    General: Skin is warm and dry.  Neurological:     General: No focal deficit present.     Mental Status: She is alert. Mental status is at baseline.  Psychiatric:        Mood and Affect: Mood normal.        Behavior: Behavior normal.     (all labs ordered are listed, but only abnormal results are displayed) Labs Reviewed  COMPREHENSIVE METABOLIC PANEL WITH GFR - Abnormal; Notable for the following components:      Result Value   CO2 21 (*)    Glucose, Bld 112 (*)    Total Bilirubin 1.5 (*)    All other components within normal limits  URINALYSIS, ROUTINE W REFLEX MICROSCOPIC - Abnormal; Notable for the following components:   Hgb urine dipstick LARGE (*)    Ketones, ur 5 (*)    Protein, ur 30 (*)    Bacteria, UA RARE (*)    All other components within normal limits  I-STAT CHEM 8, ED - Abnormal; Notable for the following  components:   Glucose, Bld 113 (*)    Calcium, Ion 1.11 (*)    TCO2 21 (*)    All other components within normal limits  LIPASE, BLOOD  CBC  HCG, SERUM, QUALITATIVE    EKG: None  Radiology: CT ABDOMEN PELVIS W CONTRAST Result Date: 10/15/2023 CLINICAL DATA:  33 year old female with abdominal pain, nausea vomiting. EXAM: CT ABDOMEN AND PELVIS WITH CONTRAST TECHNIQUE: Multidetector CT imaging of the abdomen and pelvis was performed using the standard protocol following bolus administration of intravenous contrast. RADIATION DOSE REDUCTION: This exam was performed according to the departmental dose-optimization program which includes automated exposure control, adjustment of the mA and/or kV according to patient size and/or use of iterative reconstruction technique. CONTRAST:  75mL OMNIPAQUE  IOHEXOL  350 MG/ML SOLN COMPARISON:  None Available. FINDINGS: Lower chest: Somewhat low  lung volumes, otherwise negative. Mild symmetric dependent atelectasis. Hepatobiliary: Small volume perihepatic ascites (coronal image 44) with mildly complex fluid density. Liver and gallbladder otherwise appear normal. No bile duct enlargement. Pancreas: Negative. Spleen: Trace perisplenic fluid. This is primarily between the spleen in the diaphragm on coronal image 30. Mildly complex fluid density. Splenic enhancement and size are normal. Adrenals/Urinary Tract: Negative adrenal glands. Kidneys appears symmetric and normal. No hydroureter. Some free fluid abutting the bladder, but the bladder otherwise appears negative. Stomach/Bowel: Small volume of free fluid in the bilateral abdomen and pelvis, most confluent in both lower quadrants (coronal image 57). Mildly complex fluid density in places, but not to the level of overt/traumatic hemoperitoneum. The appendix remains normal despite adjacent free fluid, contains gas on coronal images 46 through 52 and is nondilated. Nondilated large and small bowel loops elsewhere in the abdomen and pelvis. No pneumoperitoneum. Mild retained stool. Small volume fluid in the stomach. Duodenum is decompressed. Of all the bowel loops the sigmoid colon does appear to be mildly thickened and indistinct along its entire course. This has a very gradual transition with the upstream descending colon. Vascular/Lymphatic: Major vascular structures in the abdomen and pelvis appear patent and normal. No lymphadenopathy identified. Reproductive: Mixed density, macroscopic fat containing left ovarian mass measures up to 2.8 cm diameter (series 2, image 63). Surrounding mildly complex density free fluid in both lower quadrants and inseparable from both ovaries. See bowel findings above. But uterus and right ovary appear within normal limits. Other: No layering or deep pelvic free fluid. Musculoskeletal: Negative. IMPRESSION: 1. Dominant finding is small but widely scattered volume of free  fluid in both the abdomen and pelvis which has mildly complex fluid density. This does not appear to be overt hemoperitoneum, and is nonspecific. Recommend correlation with hemoglobin/hematocrit level, and quantitative beta HCG. Recently ruptured ovarian cyst is a consideration in this age group (see also #2). Appendix is normal. And there is questionable mild inflammation only in the distal descending and proximal sigmoid colon, and this could be secondary rather than primary. 2. 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 Recommend GYN Surgery follow-up. Electronically Signed   By: VEAR Hurst M.D.   On: 10/15/2023 08:21     Procedures   Medications Ordered in the ED  ketorolac  (TORADOL ) 30 MG/ML injection 30 mg (30 mg Intravenous Given 10/15/23 0743)  ondansetron  (ZOFRAN ) injection 4 mg (4 mg Intravenous Given 10/15/23 0743)  iohexol  (OMNIPAQUE ) 350 MG/ML injection 75 mL (75 mLs Intravenous Contrast Given 10/15/23 0804)  Medical Decision Making Amount and/or Complexity of Data Reviewed Radiology: ordered.  Risk Prescription drug management.   This patient presents to the ED with concern for lower abdominal pain. This involves an extensive number of treatment options, and is a complaint that carries with it a high risk of complications and morbidity.  The differential diagnosis includes colitis versus appendicitis versus cystitis versus ovarian cyst versus other  I ordered and personally interpreted labs.  The pertinent results include: No emergent findings on labs.  UA does have hemoglobin noted, but the patient reports that she is on her menses.  This is likely mixed.  No evidence of UTI  I ordered imaging studies including CT abdomen pelvis I independently visualized and interpreted imaging which showed free fluid in the abdomen, no clear evidence of acute appendicitis or other inflammatory pelvic intra-abdominal condition I agree  with the radiologist interpretation  The patient was maintained on a cardiac monitor.  I personally viewed and interpreted the cardiac monitored which showed an underlying rhythm of: Sinus rhythm  I ordered medication including IV Toradol  for abdominal pain.  IV Zofran  for nausea I have reviewed the patients home medicines and have made adjustments as needed  Test Considered: Lower suspicion for PID, ovarian torsion, or acute pelvic pathology based on this presentation  After the interventions noted above, I reevaluated the patient and found that they have: improved  Social Determinants of Health: Investment banker, corporate used  Following CT imaging I spoke to the patient again with translator [place] [phone].  I explained to the patient that it is not clear what the cause or etiology of her abdominal pain and free fluid would be.  High on the differential would be a ruptured ovarian cyst.  I did recommend that we proceed with a pelvic exam to evaluate for pelvic infection, but the patient refused that exam, denying any concern for STI exposure or vaginal discharge.  I explained to her that this history alone does not rule out the possibility of serious pelvic infection.  She still does not want a pelvic exam.  Her pain is improved and her vital signs have been unremarkable.    I will refer her to OB/GYN as she may benefit from an outpatient pelvic ultrasound, given concern for ovarian mass and reported complex fluid around the ovaries, although I suspect this is most likely blood.  I do not see evidence of sepsis toxicity, fever, leukocytosis, to warrant antibiotics at this time.  Close return precautions discussed with patient -specifically for fever, worsening pain, persistent vomiting.  She verbalized understanding.  In the meantime we will start her on anti-inflammatory NSAIDs at home.  Hospitalization was considered -the patient was well-appearing and tolerating p.o. and wanting outpatient  treatment instead which is reasonable  Disposition:  After consideration of the diagnostic results and the patients response to treatment, I feel that the patent would benefit from close outpatient follow up.      Final diagnoses:  Generalized abdominal pain    ED Discharge Orders          Ordered    ibuprofen  (ADVIL ) 600 MG tablet  Every 6 hours PRN        10/15/23 0846    ondansetron  (ZOFRAN -ODT) 4 MG disintegrating tablet  Every 8 hours PRN        10/15/23 0846               Cottie Donnice PARAS, MD 10/15/23 [phone]

## 2023-10-15 NOTE — Discharge Instructions (Signed)
 If your pain is getting worse, or you have fevers, or vomiting, please return to the emergency room.

## 2023-10-15 NOTE — ED Triage Notes (Signed)
 Pt in with abdominal pain and n/v x 3 days. States the pain feels like trapped gas, abd swollen

## 2023-10-16 DIAGNOSIS — Z419 Encounter for procedure for purposes other than remedying health state, unspecified: Secondary | ICD-10-CM | POA: Diagnosis not present

## 2023-10-23 ENCOUNTER — Encounter: Payer: Self-pay | Admitting: Obstetrics and Gynecology

## 2023-10-23 ENCOUNTER — Ambulatory Visit (INDEPENDENT_AMBULATORY_CARE_PROVIDER_SITE_OTHER): Admitting: Obstetrics and Gynecology

## 2023-10-23 ENCOUNTER — Other Ambulatory Visit: Payer: Self-pay

## 2023-10-23 VITALS — BP 141/102 | HR 64 | Ht 59.0 in | Wt 151.0 lb

## 2023-10-23 DIAGNOSIS — Z3169 Encounter for other general counseling and advice on procreation: Secondary | ICD-10-CM

## 2023-10-23 DIAGNOSIS — D271 Benign neoplasm of left ovary: Secondary | ICD-10-CM

## 2023-10-23 NOTE — Patient Instructions (Addendum)
 Rudi kwa kipimo cha pap. Katika ziara yako inayofuata tutafanya uchunguzi wa pelvis.   [employer] 23. Fika saa moja na nusu asubuhi.

## 2023-10-23 NOTE — Progress Notes (Signed)
 NEW GYNECOLOGY PATIENT Patient name: Melanie Greene MRN [id number]  Date of birth: [birth year] Chief Complaint:   New GYN  History:  Melanie Greene 33 y.o. G3P0 primarily concerned with fertility. Has been trying for 9 years. Had recent CT scan . Felt like this last year but didn't see a doctor. Has been having regular monthly cycles. Did not need assistance with conceiving in the past. Denies hx of pelvic infection. Reprots having had a prior US   Reports done in 2019. Has not ever had a pap smear done. Prior surgeries  - 1 prior cesarean section. Not sure if there were any complications at the time of her cesarean that would have caused her to have an inabilty to become pregnancy since then. Same partner for her prior pregnancies that she is trying to conceive with.  No diarrhea or constipation. No fever or chills. No urinary changes. No changes in weight.  Nausea will occur just once a year. Starts with burning sensation and then it will all resolves.  No recent travel outside the [place]       Gynecologic History Patient's last menstrual period was 10/18/2023 (exact date). Contraception: none   OB History  Gravida Para Term Preterm AB Living  3     3  SAB IAB Ectopic Multiple Live Births          # Outcome Date GA Lbr Len/2nd Weight Sex Type Anes PTL Lv  3 Gravida           2 Gravida           1 Gravida              The following portions of the patient's history were reviewed and updated as appropriate: allergies, current medications, past family history, past medical history, past social history, past surgical history and problem list. Health Maintenance  Topic Date Due   HIV Screening  Never done   Hepatitis C Screening  Never done   DTaP/Tdap/Td vaccine (1 - Tdap) Never done   Hepatitis B Vaccine (1 of 3 - 19+ 3-dose series) Never done   HPV Vaccine (1 - 3-dose SCDM series) Never done   Pap with HPV screening  Never done   Flu Shot  09/04/2023   COVID-19 Vaccine (1 -  2024-25 season) Never done   Pneumococcal Vaccine  Aged Out   Meningitis B Vaccine  Aged Out     Review of Systems Pertinent items noted in HPI and remainder of comprehensive ROS otherwise negative.  Physical Exam:  BP (!) 141/102   Pulse 64   Ht 4' 11 (1.499 m)   Wt 151 lb (68.5 kg)   LMP 10/18/2023 (Exact Date)   BMI 30.50 kg/m  Physical Exam Vitals and nursing note reviewed.  Constitutional:      Appearance: Normal appearance.  Cardiovascular:     Rate and Rhythm: Normal rate.  Pulmonary:     Effort: Pulmonary effort is normal.     Breath sounds: Normal breath sounds.  Neurological:     General: No focal deficit present.     Mental Status: She is alert and oriented to person, place, and time.  Psychiatric:        Mood and Affect: Mood normal.        Behavior: Behavior normal.        Thought Content: Thought content normal.        Judgment: Judgment normal.        Assessment and Plan:  1. Dermoid cyst of left ovary (Primary) Pelvic US  for further characterization of dermoid and surrounding fluid.  - US  Pelvic Complete With Transvaginal; Future  2. Infertility counseling Labs and US  to assess cyst and possible contributions to infertility. Noted that if no evidence of hydrosalpinx can plan for HSG to assess tubal patency. Pap and pelvic exam to be completed at follow up.  - Hemoglobin A1c - TSH Rfx on Abnormal to Free T4 - Follicle stimulating hormone   Follow-up: No follow-ups on file.      Carter Quarry, MD Obstetrician & Gynecologist, [facility] [employer], [facility]

## 2023-10-24 LAB — HEMOGLOBIN A1C
Est. average glucose Bld gHb Est-mCnc: 100 mg/dL
Hgb A1c MFr Bld: 5.1 % (ref 4.8–5.6)

## 2023-10-24 LAB — TSH RFX ON ABNORMAL TO FREE T4: TSH: 8.13 u[IU]/mL — ABNORMAL HIGH (ref 0.450–4.500)

## 2023-10-24 LAB — T4F: T4,Free (Direct): 1.4 ng/dL (ref 0.82–1.77)

## 2023-10-24 LAB — FOLLICLE STIMULATING HORMONE: FSH: 3.2 m[IU]/mL

## 2023-10-26 ENCOUNTER — Ambulatory Visit: Payer: Self-pay | Admitting: Obstetrics and Gynecology

## 2023-10-27 ENCOUNTER — Ambulatory Visit (HOSPITAL_COMMUNITY)

## 2023-10-28 NOTE — Telephone Encounter (Signed)
 I called patient with [facility] # [phone] and left a message we are calling with some non urgent information and please call our office back.  Rock Skip PEAK

## 2023-10-28 NOTE — Telephone Encounter (Signed)
-----   Message from Carter Quarry sent at 10/26/2023  6:19 PM EDT ----- Labs show some level of thyroid  dysfunction. Let's plan to repeat in 1 month (at your follow up). We will see what the ultrasound shows  ----- Message ----- From: Interface, [facility] Lab Results In Sent: 10/24/2023   4:36 AM EDT To: Carter Quarry, MD

## 2023-10-29 NOTE — Telephone Encounter (Addendum)
 RN called pt using [employer] [phone].  Advised her of abnormal thyroid  labs pt that provider recommends rechecking labs at her appointment on 11/27/23 at 11:15am. Reminded pt of her Ultrasound appointment on 11/03/23 at 08:00 at [facility].  Pt verbalized understanding and had no further questions.   [place], RN    ----- Message ----- From: Jeralyn Crutch, MD Sent: 10/26/2023   6:19 PM EDT To: Corrina Sabin, MD; [facility]  Labs show some level of thyroid  dysfunction. Let's plan to repeat in 1 month (at your follow up). We will see what the ultrasound shows  ----- Message ----- From: Interface, [facility] Lab Results In Sent: 10/24/2023   4:36 AM EDT To: Crutch Jeralyn, MD

## 2023-11-03 ENCOUNTER — Ambulatory Visit (HOSPITAL_COMMUNITY)
Admission: RE | Admit: 2023-11-03 | Discharge: 2023-11-03 | Disposition: A | Discharge status: Home / Self Care | Source: Ambulatory Visit | Attending: Obstetrics and Gynecology | Admitting: Obstetrics and Gynecology

## 2023-11-03 DIAGNOSIS — D271 Benign neoplasm of left ovary: Secondary | ICD-10-CM | POA: Diagnosis not present

## 2023-11-03 DIAGNOSIS — N979 Female infertility, unspecified: Secondary | ICD-10-CM | POA: Diagnosis not present

## 2023-11-03 DIAGNOSIS — N83209 Unspecified ovarian cyst, unspecified side: Secondary | ICD-10-CM | POA: Diagnosis not present

## 2023-11-12 NOTE — Telephone Encounter (Signed)
 Called pt w/[employer] interpreter [phone]. She was informed of recent thyroid  test result which was abnormal and that Dr. Nowell recommends to repeat the test @ her next appointment. Pt was also informed of ultrasound test results as requested by Dr. Jeralyn. This will be discussed further during her upcoming appt on 10/24 @ 11:15. She may require a repeat ultrasound or other type of test for follow up. Pt voiced understanding.

## 2023-11-15 DIAGNOSIS — Z419 Encounter for procedure for purposes other than remedying health state, unspecified: Secondary | ICD-10-CM | POA: Diagnosis not present

## 2023-11-27 ENCOUNTER — Ambulatory Visit: Admitting: Obstetrics and Gynecology

## 2023-12-11 ENCOUNTER — Ambulatory Visit: Admitting: Obstetrics and Gynecology

## 2024-01-15 DIAGNOSIS — Z419 Encounter for procedure for purposes other than remedying health state, unspecified: Secondary | ICD-10-CM | POA: Diagnosis not present

## 2024-02-02 ENCOUNTER — Other Ambulatory Visit: Payer: Self-pay | Admitting: Obstetrics and Gynecology

## 2024-02-02 DIAGNOSIS — R7989 Other specified abnormal findings of blood chemistry: Secondary | ICD-10-CM
